# Patient Record
Sex: Female | Born: 1995 | Race: Asian | Hispanic: No | Marital: Single
Health system: Southern US, Community
[De-identification: ages and names within clinical notes are randomized; demographics above are authoritative.]

---
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
## 2020-08-30 ENCOUNTER — Other Ambulatory Visit: Payer: Self-pay

## 2020-08-30 ENCOUNTER — Emergency Department (HOSPITAL_COMMUNITY)
Admission: EM | Admit: 2020-08-30 | Discharge: 2020-08-31 | Disposition: A | Discharge status: Home / Self Care | Payer: BLUE CROSS/BLUE SHIELD | Attending: Emergency Medicine | Admitting: Emergency Medicine

## 2020-08-30 ENCOUNTER — Emergency Department (HOSPITAL_COMMUNITY): Payer: BLUE CROSS/BLUE SHIELD

## 2020-08-30 DIAGNOSIS — Z5321 Procedure and treatment not carried out due to patient leaving prior to being seen by health care provider: Secondary | ICD-10-CM | POA: Diagnosis not present

## 2020-08-30 DIAGNOSIS — R109 Unspecified abdominal pain: Secondary | ICD-10-CM | POA: Insufficient documentation

## 2020-08-30 LAB — URINALYSIS, ROUTINE W REFLEX MICROSCOPIC
Bilirubin Urine: NEGATIVE
Glucose, UA: NEGATIVE mg/dL
Hgb urine dipstick: NEGATIVE
Ketones, ur: 20 mg/dL — AB
Leukocytes,Ua: NEGATIVE
Nitrite: NEGATIVE
Protein, ur: 30 mg/dL — AB
Specific Gravity, Urine: 1.024 (ref 1.005–1.030)
pH: 5 (ref 5.0–8.0)

## 2020-08-30 LAB — BASIC METABOLIC PANEL
Anion gap: 12 (ref 5–15)
BUN: 8 mg/dL (ref 6–20)
CO2: 22 mmol/L (ref 22–32)
Calcium: 9.6 mg/dL (ref 8.9–10.3)
Chloride: 104 mmol/L (ref 98–111)
Creatinine, Ser: 0.68 mg/dL (ref 0.44–1.00)
GFR, Estimated: >60 mL/min (ref >60)
Glucose, Bld: 105 mg/dL — ABNORMAL HIGH (ref 70–99)
Potassium: 3.5 mmol/L (ref 3.5–5.1)
Sodium: 138 mmol/L (ref 135–145)

## 2020-08-30 LAB — CBC
HCT: 42.8 % (ref 36.0–46.0)
Hemoglobin: 14.8 g/dL (ref 12.0–15.0)
MCH: 32 pg (ref 26.0–34.0)
MCHC: 34.6 g/dL (ref 30.0–36.0)
MCV: 92.6 fL (ref 80.0–100.0)
Platelets: 139 10*3/uL — ABNORMAL LOW (ref 150–400)
RBC: 4.62 MIL/uL (ref 3.87–5.11)
RDW: 13.3 % (ref 11.5–15.5)
WBC: 8.6 10*3/uL (ref 4.0–10.5)
nRBC: 0 % (ref 0.0–0.2)

## 2020-08-30 LAB — I-STAT BETA HCG BLOOD, ED (MC, WL, AP ONLY): I-stat hCG, quantitative: <5 m[IU]/mL (ref <5)

## 2020-08-30 NOTE — ED Notes (Signed)
No answer for VS x3 

## 2020-08-30 NOTE — ED Triage Notes (Signed)
Pt with hx of kidney infection sent here from [facility] for bilateral flank pain since last night. Denies urinary symptoms.

## 2022-05-09 IMAGING — CT CT RENAL STONE PROTOCOL
2 of 4 series · 17 of 46 positions shown, 19 images · non-contrast
Comparison: None.

CLINICAL DATA: Flank pain.

EXAM:
CT ABDOMEN AND PELVIS WITHOUT CONTRAST
TECHNIQUE: Multidetector CT imaging of the abdomen and pelvis was performed
following the standard protocol without IV contrast.

[Series 3: renal stone 5.0 · axial · 0.69mm/px · z∈[+822,+1198]mm · 14 of 83 slices shown, 16 images]
[im 4/83  soft-tissue]
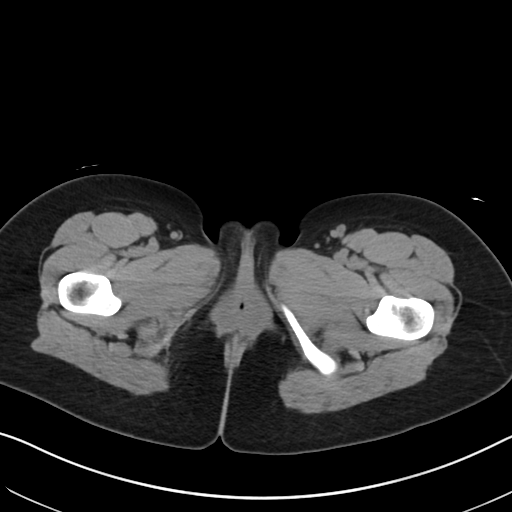
[im 4/83  bone]
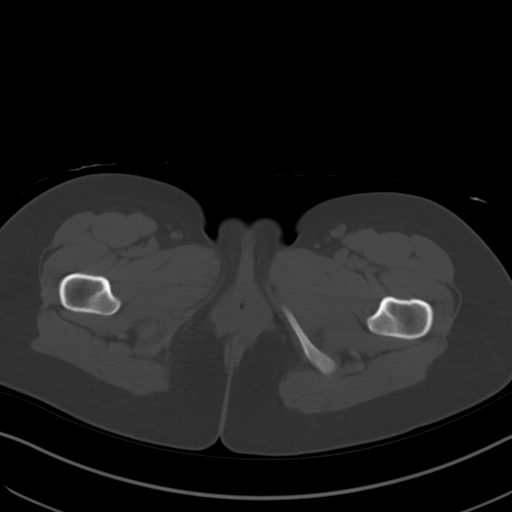
[im 11/83  soft-tissue]
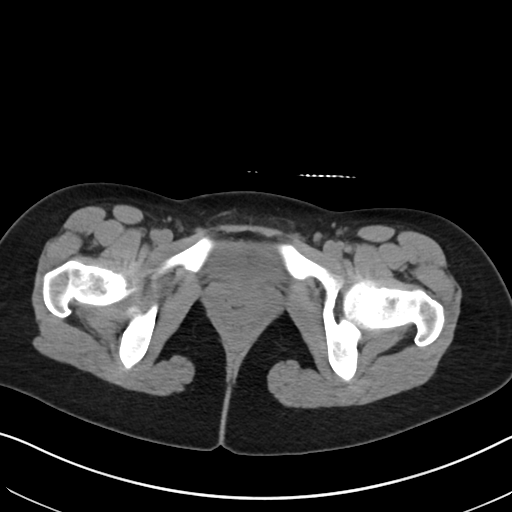
[im 18/83  soft-tissue]
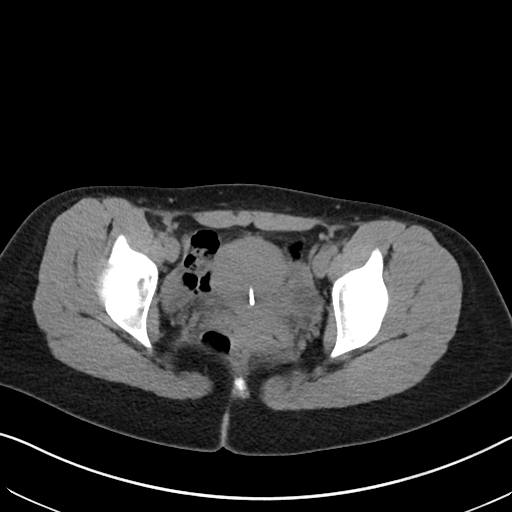
[im 21/83  soft-tissue]
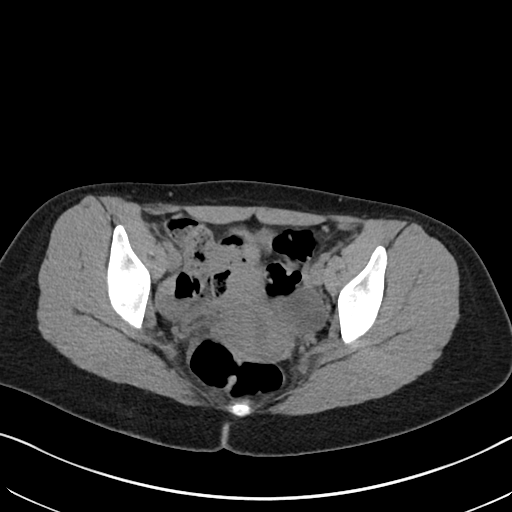
[im 28/83  soft-tissue]
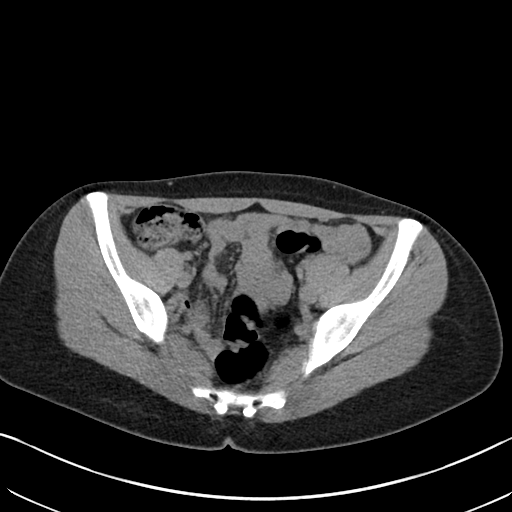
[im 35/83  soft-tissue]
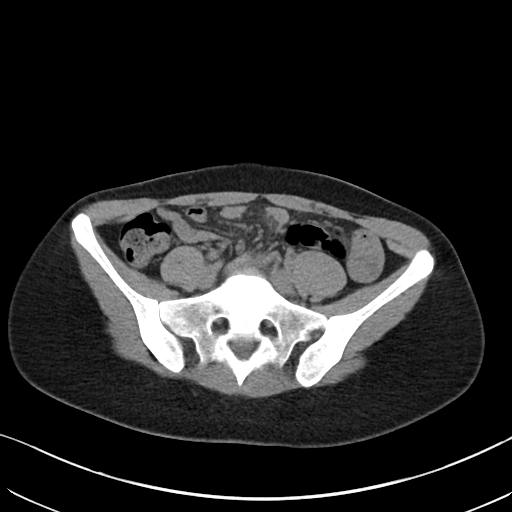
[im 38/83  soft-tissue]
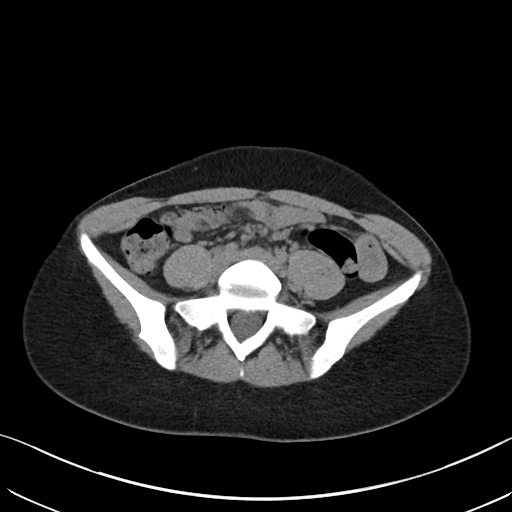
[im 45/83  soft-tissue]
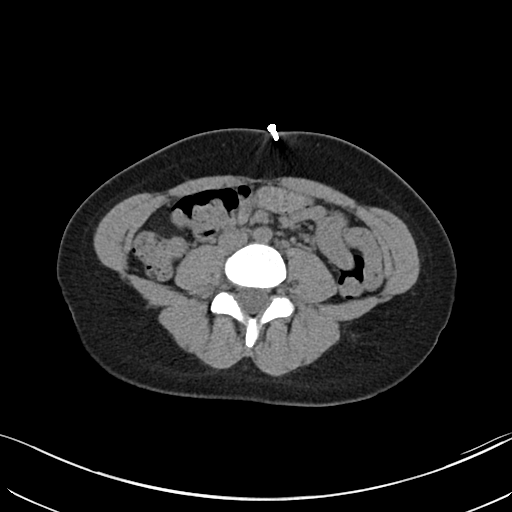
[im 48/83  soft-tissue]
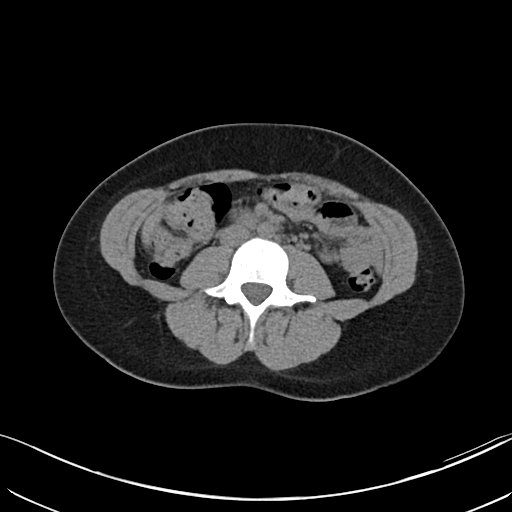
[im 48/83  bone]
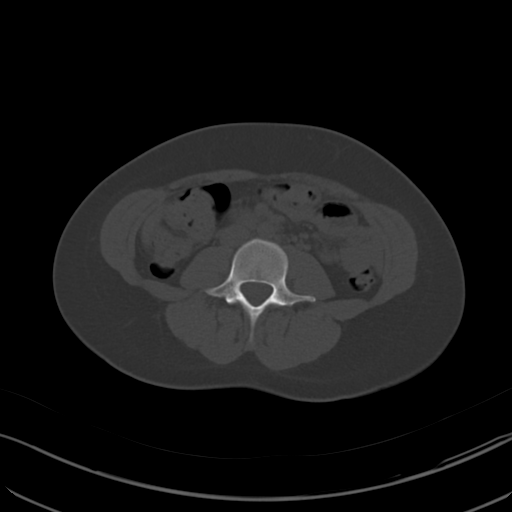
[im 55/83  soft-tissue]
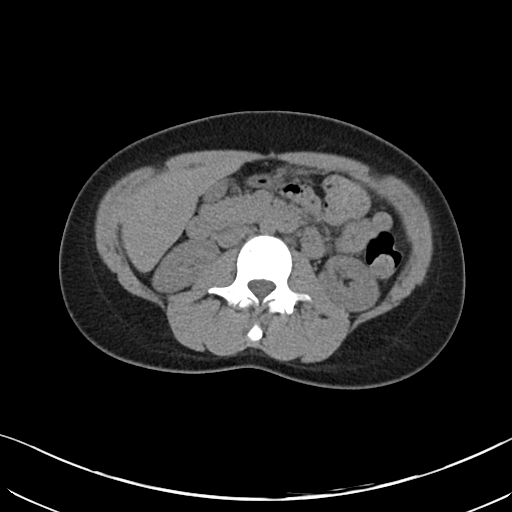
[im 62/83  soft-tissue]
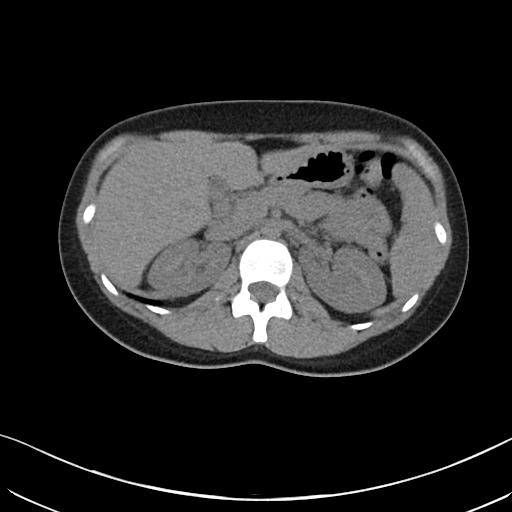
[im 65/83  soft-tissue]
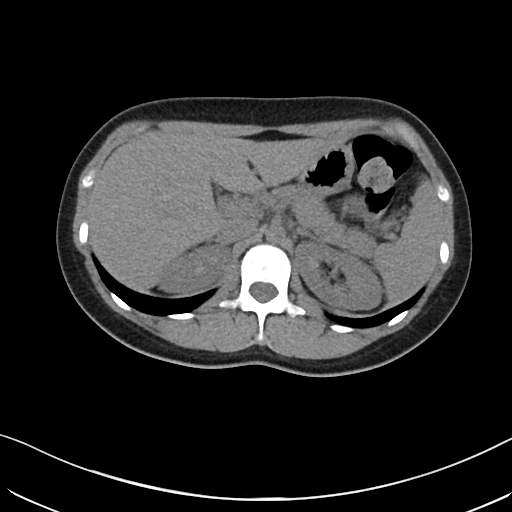
[im 72/83  soft-tissue]
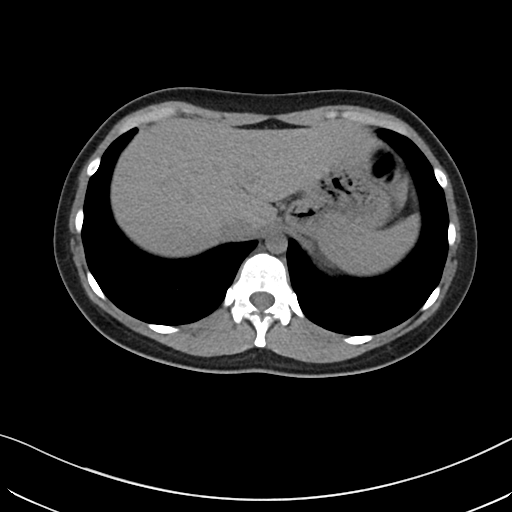
[im 79/83  soft-tissue]
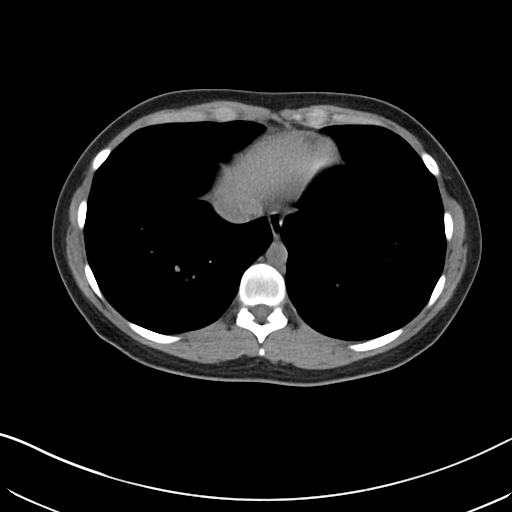

[Series 6: coronal · coronal · 0.71mm/px · 3 of 93 slices shown]
[im 31/93  soft-tissue]
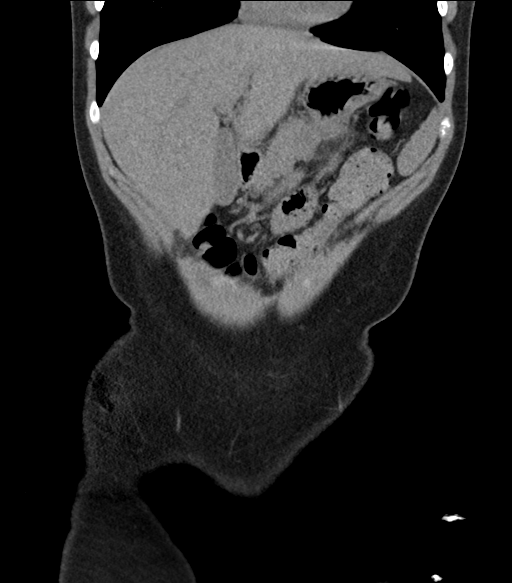
[im 41/93  soft-tissue]
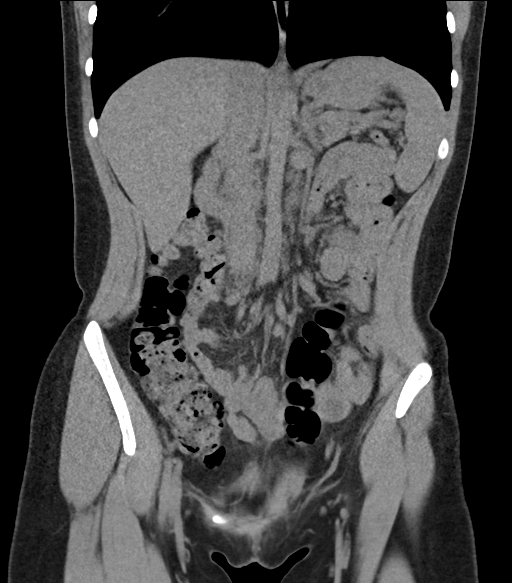
[im 52/93  soft-tissue]
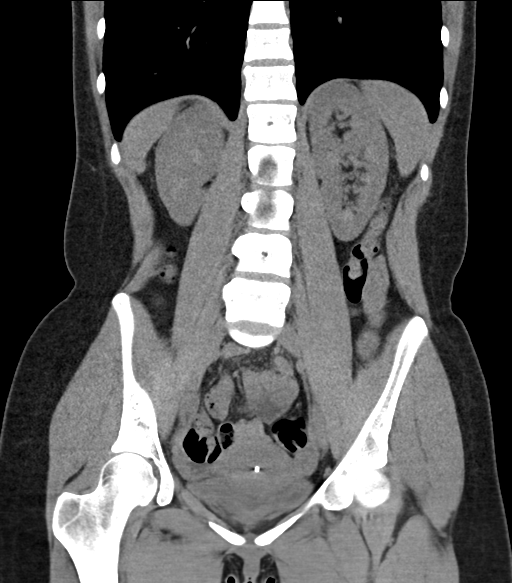

[17 of 46 positions shown; findings below may reference images not displayed]

FINDINGS: Lower chest: The lung bases are clear. The heart size is normal.

Hepatobiliary: The liver is normal. Normal gallbladder.There is no
biliary ductal dilation.

Pancreas: Normal contours without ductal dilatation. No
peripancreatic fluid collection.

Spleen: Unremarkable.

Adrenals/Urinary Tract:

--Adrenal glands: Unremarkable.

--Right kidney/ureter: No hydronephrosis or radiopaque kidney
stones.

--Left kidney/ureter: No hydronephrosis or radiopaque kidney stones.

--Urinary bladder: The bladder is decompressed which limits
evaluation.

Stomach/Bowel:

--Stomach/Duodenum: No hiatal hernia or other gastric abnormality.
Normal duodenal course and caliber.

--Small bowel: Unremarkable.

--Colon: Unremarkable.

--Appendix: Normal.

Vascular/Lymphatic: Normal course and caliber of the major abdominal
vessels.

--No retroperitoneal lymphadenopathy.

--No mesenteric lymphadenopathy.

--No pelvic or inguinal lymphadenopathy.

Reproductive: There is an IUD in place.

Other: There is a small volume of pelvic free fluid which is likely
physiologic. No free air. The abdominal wall is normal.

Musculoskeletal. No acute displaced fractures.
IMPRESSION: No acute abdominopelvic abnormality.
